# Patient Record
Sex: Female | Born: 1980 | Race: White | Hispanic: No | Smoking: Former smoker
Health system: Southern US, Community
[De-identification: ages and names within clinical notes are randomized; demographics above are authoritative.]

## PROBLEM LIST (undated history)

## (undated) DIAGNOSIS — J45909 Unspecified asthma, uncomplicated: Secondary | ICD-10-CM

---

## 2009-01-24 ENCOUNTER — Emergency Department (HOSPITAL_COMMUNITY): Admission: EM | Admit: 2009-01-24 | Discharge: 2009-01-24 | Payer: Self-pay | Admitting: Emergency Medicine

## 2012-08-26 ENCOUNTER — Encounter (HOSPITAL_COMMUNITY): Payer: Self-pay | Admitting: *Deleted

## 2012-08-26 DIAGNOSIS — F17200 Nicotine dependence, unspecified, uncomplicated: Secondary | ICD-10-CM | POA: Insufficient documentation

## 2012-08-26 DIAGNOSIS — J40 Bronchitis, not specified as acute or chronic: Secondary | ICD-10-CM | POA: Insufficient documentation

## 2012-08-26 MED ORDER — ALBUTEROL SULFATE (5 MG/ML) 0.5% IN NEBU
5.0000 mg | INHALATION_SOLUTION | RESPIRATORY_TRACT | Status: DC
  Administered 2012-08-26 – 2012-08-27 (×2): 5 mg via RESPIRATORY_TRACT
  Filled 2012-08-26 (×2): qty 1

## 2012-08-26 MED ORDER — ALBUTEROL SULFATE (5 MG/ML) 0.5% IN NEBU
5.0000 mg | INHALATION_SOLUTION | Freq: Once | RESPIRATORY_TRACT | Status: AC
  Administered 2012-08-26: 5 mg via RESPIRATORY_TRACT
  Filled 2012-08-26: qty 1

## 2012-08-26 MED ORDER — IPRATROPIUM BROMIDE 0.02 % IN SOLN
0.5000 mg | Freq: Once | RESPIRATORY_TRACT | Status: AC
  Administered 2012-08-26: 0.5 mg via RESPIRATORY_TRACT
  Filled 2012-08-26: qty 2.5

## 2012-08-26 MED ORDER — IPRATROPIUM BROMIDE 0.02 % IN SOLN
0.5000 mg | RESPIRATORY_TRACT | Status: DC
  Administered 2012-08-26 – 2012-08-27 (×2): 0.5 mg via RESPIRATORY_TRACT
  Filled 2012-08-26 (×2): qty 2.5

## 2012-08-26 NOTE — ED Notes (Signed)
The pt reports she has had difficulty breathing and wheezing and coughing since last pm.  She has a hx of asthma

## 2012-08-26 NOTE — ED Notes (Signed)
The pt is very anxious hyperventilating  And tingling all over her body

## 2012-08-26 NOTE — ED Notes (Addendum)
Spoke with Dr. Silverio Lay and advised that the patient's work of breathing had increased.  Got authorization to give a second duoneb treatment (first at 2023).  Advised Herbert Deaner, RN that the MD ordered a breathing treatment and pointed out the patient to her.  Alfonzo Feller, RN (Consulting civil engineer) is aware of the patient's increased work of breathing.

## 2012-08-26 NOTE — ED Notes (Signed)
The pt was reported  To be in respiratory difficulty by rn damon.  However i triaged this pt earlier and gave her a hhn.  At the present time she is in no respiratory difficulty.  She sounds much better little coughing and she is no longer as anxious.  hhn given per doctors order  In triage room 3

## 2012-08-27 ENCOUNTER — Encounter (HOSPITAL_COMMUNITY): Payer: Self-pay | Admitting: *Deleted

## 2012-08-27 ENCOUNTER — Emergency Department (HOSPITAL_COMMUNITY)
Admission: EM | Admit: 2012-08-27 | Discharge: 2012-08-27 | Disposition: A | Discharge status: Home / Self Care | Payer: Self-pay | Attending: Emergency Medicine | Admitting: Emergency Medicine

## 2012-08-27 ENCOUNTER — Emergency Department (HOSPITAL_COMMUNITY)
Admit: 2012-08-27 | Discharge: 2012-08-27 | Disposition: A | Discharge status: Home / Self Care | Payer: Self-pay | Attending: Emergency Medicine | Admitting: Emergency Medicine

## 2012-08-27 DIAGNOSIS — J40 Bronchitis, not specified as acute or chronic: Secondary | ICD-10-CM

## 2012-08-27 HISTORY — DX: Unspecified asthma, uncomplicated: J45.909

## 2012-08-27 LAB — CBC WITH DIFFERENTIAL/PLATELET
Eosinophils Relative: 2 % (ref 0–5)
HCT: 37.7 % (ref 36.0–46.0)
Hemoglobin: 12.3 g/dL (ref 12.0–15.0)
Lymphocytes Relative: 11 % — ABNORMAL LOW (ref 12–46)
Lymphs Abs: 1.5 10*3/uL (ref 0.7–4.0)
MCV: 89.1 fL (ref 78.0–100.0)
Monocytes Absolute: 0.8 10*3/uL (ref 0.1–1.0)
Monocytes Relative: 6 % (ref 3–12)
Platelets: 273 10*3/uL (ref 150–400)
RBC: 4.23 MIL/uL (ref 3.87–5.11)
WBC: 13.6 10*3/uL — ABNORMAL HIGH (ref 4.0–10.5)

## 2012-08-27 LAB — POCT I-STAT, CHEM 8
BUN: 7 mg/dL (ref 6–23)
Creatinine, Ser: 0.7 mg/dL (ref 0.50–1.10)
Sodium: 140 mEq/L (ref 135–145)
TCO2: 23 mmol/L (ref 0–100)

## 2012-08-27 MED ORDER — SODIUM CHLORIDE 0.9 % IV BOLUS (SEPSIS)
1000.0000 mL | Freq: Once | INTRAVENOUS | Status: AC
  Administered 2012-08-27: 1000 mL via INTRAVENOUS

## 2012-08-27 MED ORDER — AEROCHAMBER PLUS W/MASK MISC
Status: AC
  Administered 2012-08-27: 04:00:00
  Filled 2012-08-27: qty 1

## 2012-08-27 MED ORDER — ALBUTEROL SULFATE HFA 108 (90 BASE) MCG/ACT IN AERS
2.0000 | INHALATION_SPRAY | RESPIRATORY_TRACT | Status: DC | PRN
  Administered 2012-08-27: 2 via RESPIRATORY_TRACT
  Filled 2012-08-27: qty 6.7

## 2012-08-27 NOTE — ED Notes (Signed)
Back to room via w/c, alert, NAD, calm, interactive, reports temporary improvement from previous 2 nebs. Speaking in short sentences, resps e/u labored, increased wob, mild fever noted, easily provoked cough (movement, speaking & deep resps), some back pain from coughing. Family at Bs.

## 2012-08-27 NOTE — ED Provider Notes (Signed)
History     CSN: [id number]  Arrival date & time 08/26/12  2003   First MD Initiated Contact with Patient 08/27/12 0021      Chief Complaint  Patient presents with  . Shortness of Breath    (Consider location/radiation/quality/duration/timing/severity/associated sxs/prior treatment) HPI Comments: Patient reports, URI symptoms, with cough and congestion and wheezing.  For the past 36 hours.  She has a remote history of asthma as a child  The history is provided by the patient.    Past Medical History  Diagnosis Date  . Asthma     History reviewed. No pertinent past surgical history.  No family history on file.  History  Substance Use Topics  . Smoking status: Current Every Day Smoker  . Smokeless tobacco: Not on file  . Alcohol Use: No    OB History    Grav Para Term Preterm Abortions TAB SAB Ect Mult Living                  Review of Systems  Constitutional: Positive for fever. Negative for chills.  HENT: Positive for rhinorrhea.   Respiratory: Positive for shortness of breath and wheezing.   Cardiovascular: Negative for chest pain.  Musculoskeletal: Positive for myalgias.  Neurological: Negative for dizziness, weakness and headaches.    Allergies  Penicillins and Sulfa antibiotics  Home Medications   Current Outpatient Rx  Name Route Sig Dispense Refill  . IBUPROFEN 200 MG PO TABS Oral Take 200 mg by mouth every 6 (six) hours as needed. For pain      BP 109/71  Pulse 97  Temp 98.8 F (37.1 C) (Oral)  Resp 20  SpO2 94%  Physical Exam  Constitutional: She is oriented to person, place, and time. She appears well-developed.  Eyes: Pupils are equal, round, and reactive to light.  Neck: Normal range of motion.  Cardiovascular: Tachycardia present.   Pulmonary/Chest: No respiratory distress. She has wheezes. She exhibits no tenderness.  Musculoskeletal: Normal range of motion.  Neurological: She is alert and oriented to person, place, and time.    Skin: Skin is warm.    ED Course  Procedures (including critical care time)  Labs Reviewed  CBC WITH DIFFERENTIAL - Abnormal; Notable for the following:    WBC 13.6 (*)     Neutrophils Relative 81 (*)     Neutro Abs 11.1 (*)     Lymphocytes Relative 11 (*)     All other components within normal limits  POCT I-STAT, CHEM 8 - Abnormal; Notable for the following:    Potassium 3.4 (*)     Glucose, Bld 103 (*)     All other components within normal limits   Dg Chest 2 View  08/27/2012  *RADIOLOGY REPORT*  Clinical Data: Fever, cough and shortness of breath.  CHEST - 2 VIEW  Comparison: None.  Findings: Lungs are clear.  Heart size is normal.  No pneumothorax or pleural fluid.  IMPRESSION: Negative chest.   Original Report Authenticated By: Bernadene Bell. D'ALESSIO, M.D.      1. Bronchitis       MDM   Patient has been given several albuterol treatment in the emergency department.  She is starting to loosen up and breathe easier.  She will be provided with an albuterol inhaler upon discharge with instructions for its use accessory reveals no pneumonia        Arman Filter, NP 08/27/12 0333  Arman Filter, NP 08/27/12 5284  Arman Filter,  NP 08/27/12 0333  Arman Filter, NP 08/27/12 0335  Arman Filter, NP 08/27/12 [phone]

## 2012-08-27 NOTE — ED Notes (Signed)
Pt ambulatory to bathroom

## 2013-09-13 ENCOUNTER — Emergency Department (HOSPITAL_BASED_OUTPATIENT_CLINIC_OR_DEPARTMENT_OTHER): Payer: No Typology Code available for payment source

## 2013-09-13 ENCOUNTER — Emergency Department (HOSPITAL_BASED_OUTPATIENT_CLINIC_OR_DEPARTMENT_OTHER): Payer: Self-pay

## 2013-09-13 ENCOUNTER — Encounter (HOSPITAL_BASED_OUTPATIENT_CLINIC_OR_DEPARTMENT_OTHER): Payer: Self-pay | Admitting: Emergency Medicine

## 2013-09-13 ENCOUNTER — Emergency Department (HOSPITAL_BASED_OUTPATIENT_CLINIC_OR_DEPARTMENT_OTHER)
Admission: EM | Admit: 2013-09-13 | Discharge: 2013-09-14 | Disposition: A | Discharge status: Home / Self Care | Payer: No Typology Code available for payment source | Attending: Emergency Medicine | Admitting: Emergency Medicine

## 2013-09-13 DIAGNOSIS — Z3202 Encounter for pregnancy test, result negative: Secondary | ICD-10-CM | POA: Insufficient documentation

## 2013-09-13 DIAGNOSIS — Z87891 Personal history of nicotine dependence: Secondary | ICD-10-CM | POA: Insufficient documentation

## 2013-09-13 DIAGNOSIS — S060X9A Concussion with loss of consciousness of unspecified duration, initial encounter: Secondary | ICD-10-CM | POA: Insufficient documentation

## 2013-09-13 DIAGNOSIS — S0993XA Unspecified injury of face, initial encounter: Secondary | ICD-10-CM | POA: Insufficient documentation

## 2013-09-13 DIAGNOSIS — Z880 Allergy status to penicillin: Secondary | ICD-10-CM | POA: Insufficient documentation

## 2013-09-13 DIAGNOSIS — S0990XA Unspecified injury of head, initial encounter: Secondary | ICD-10-CM

## 2013-09-13 DIAGNOSIS — Y9389 Activity, other specified: Secondary | ICD-10-CM | POA: Insufficient documentation

## 2013-09-13 DIAGNOSIS — Y92410 Unspecified street and highway as the place of occurrence of the external cause: Secondary | ICD-10-CM | POA: Insufficient documentation

## 2013-09-13 DIAGNOSIS — M542 Cervicalgia: Secondary | ICD-10-CM

## 2013-09-13 DIAGNOSIS — J45909 Unspecified asthma, uncomplicated: Secondary | ICD-10-CM | POA: Insufficient documentation
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
## 2013-09-13 NOTE — ED Notes (Signed)
HPPD at bedside 

## 2013-09-13 NOTE — ED Notes (Signed)
C Collar remains in place from EMS.

## 2013-09-14 MED ORDER — IBUPROFEN 800 MG PO TABS: 800.0000 mg | ORAL_TABLET | Freq: Three times a day (TID) | ORAL | Status: AC

## 2013-09-14 MED ORDER — IBUPROFEN 800 MG PO TABS
800.0000 mg | ORAL_TABLET | Freq: Once | ORAL | Status: AC
  Administered 2013-09-14: 800 mg via ORAL
  Filled 2013-09-14: qty 1

## 2013-09-14 NOTE — ED Notes (Signed)
Dr. Karma Ganja at bedside, C Collar has been removed.

## 2013-09-14 NOTE — ED Notes (Signed)
Has returned from radiology. 

## 2013-11-12 ENCOUNTER — Emergency Department (HOSPITAL_COMMUNITY): Payer: Self-pay

## 2013-11-12 ENCOUNTER — Emergency Department (HOSPITAL_COMMUNITY)
Admission: EM | Admit: 2013-11-12 | Discharge: 2013-11-12 | Disposition: A | Discharge status: Home / Self Care | Payer: Self-pay | Attending: Emergency Medicine | Admitting: Emergency Medicine

## 2013-11-12 ENCOUNTER — Encounter (HOSPITAL_COMMUNITY): Payer: Self-pay | Admitting: Emergency Medicine

## 2013-11-12 DIAGNOSIS — Z87891 Personal history of nicotine dependence: Secondary | ICD-10-CM | POA: Insufficient documentation

## 2013-11-12 DIAGNOSIS — G510 Bell's palsy: Secondary | ICD-10-CM | POA: Insufficient documentation

## 2013-11-12 DIAGNOSIS — J45909 Unspecified asthma, uncomplicated: Secondary | ICD-10-CM | POA: Insufficient documentation

## 2013-11-12 DIAGNOSIS — Z880 Allergy status to penicillin: Secondary | ICD-10-CM | POA: Insufficient documentation

## 2013-11-12 NOTE — ED Notes (Signed)
Left facial droop and numbness x 3 weeks   Sharp pain in neck got adjusted by chiropractor 3 weeks ago . And was  Dx w/ bells palsy  Just not getting better given pred and she finishsed
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
## 2013-11-12 NOTE — ED Notes (Signed)
PT reports facial droop started 2 week and 2 days ago . Pt also reports pain to the neck on Lt side and tingling in Lt arm.

## 2013-11-12 NOTE — Discharge Instructions (Signed)
Please read and follow all provided instructions.  Your diagnoses today include:  1. Bell's palsy     Tests performed today include:  X-ray of neck - shows no broken bones  MRI brain - appears normal  Vital signs. See below for your results today.   Medications prescribed:   None   Take any prescribed medications only as directed.  Home care instructions:  Follow any educational materials contained in this packet.  Follow-up instructions: Please follow-up with your primary care provider or the neurologist provided in the next 7 days for further evaluation of your symptoms.    Return instructions:   Please return to the Emergency Department if you experience worsening symptoms.  Return if you have weakness in your arms or legs, slurred speech, trouble walking or talking, confusion, or trouble with your balance.   Please return if you have any other emergent concerns.  Additional Information:  Your vital signs today were: BP 121/70   Pulse 94   Temp(Src) 98.3 F (36.8 C) (Oral)   Resp 16   SpO2 96%   LMP 11/05/2013 If your blood pressure (BP) was elevated above 135/85 this visit, please have this repeated by your doctor within one month. --------------

## 2013-11-12 NOTE — ED Provider Notes (Signed)
CSN: [id number]     Arrival date & time 11/12/13  1431 History   First MD Initiated Contact with Patient 11/12/13 1657     Chief Complaint  Patient presents with  . Facial Pain   (Consider location/radiation/quality/duration/timing/severity/associated sxs/prior Treatment) HPI Ms. Moyinoluwa Dawe is a 32 y.o. female w/ PMHx of Asthma, presents to the ED w/ complaints of L. sided facial pain, facial droop, and numbness for the past 2-3 weeks. Patient claims she was seen at the chiropractor about 2.5 weeks ago and had a neck adjustment done at that time. She returned home later in the day and claimed to have some paraesthesias in the left side of her face, followed by numbness, and motor dysfunction. She claims that she cannot feel the left side of her face, cannot completely close her left eye, and cannot taste anything on the left side of her tongue. After her "adjustment", she returned to her MD (also affiliated w/ chiropractor) who prescribed her Prednisone 60 mg po qd for 5-7 days. She has had no improvement after corticosteroid therapy. She also claims to have some mild paraesthesias in her L. upper and lower extremities (not seen on physical exam). The patient denies any previous history of cold sores, or herpes infection. She otherwise denies other issues. No recent chest pain, SOB, nausea, vomiting, fever, chills, abdominal pain, or diarrhea.  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   Past Medical History  Diagnosis Date  . Asthma    History reviewed. No pertinent past surgical history. No family history on file. History  Substance Use Topics  . Smoking status: Former Games developer  . Smokeless tobacco: Never Used  . Alcohol Use: No   OB History   Grav Para Term Preterm Abortions TAB SAB Ect Mult Living                 Review of Systems General: Denies fever, chills, diaphoresis, appetite change and fatigue.  Respiratory: Denies SOB, DOE, cough,  chest tightness, and wheezing.   Cardiovascular: Denies chest pain, palpitations and leg swelling.  Gastrointestinal: Denies nausea, vomiting, abdominal pain, diarrhea, constipation, blood in stool and abdominal distention.  Genitourinary: Denies dysuria, urgency, frequency, hematuria, flank pain and difficulty urinating.  Endocrine: Denies hot or cold intolerance, sweats, polyuria, polydipsia. Musculoskeletal: Denies myalgias, back pain, joint swelling, arthralgias and gait problem.  Skin: Denies pallor, rash and wounds.  Neurological: Positive for numbness/tingling in left face w/ motor dysfunction. Also describes left hand weakness. Denies dizziness, seizures, syncope, weakness, lightheadedness, and headaches.  Psychiatric/Behavioral: Denies mood changes, confusion, nervousness, sleep disturbance and agitation.  Allergies  Penicillins and Sulfa antibiotics  Home Medications   Current Outpatient Rx  Name  Route  Sig  Dispense  Refill  . ibuprofen (ADVIL,MOTRIN) 800 MG tablet   Oral   Take 1 tablet (800 mg total) by mouth 3 (three) times daily.   21 tablet   0   . Ketotifen Fumarate (THERA TEARS ALLERGY OP)   Ophthalmic   Apply 1 drop to eye 3 (three) times daily as needed (dry eyes).          Physical Exam Filed Vitals:   11/12/13 1742 11/12/13 1930 11/12/13 1945 11/12/13 2000  BP: 136/92 136/81 141/85 127/80  Pulse: 103 99 97 94  Temp: 98.3 F (36.8 C)     TempSrc: Oral     Resp: 18     SpO2: 97% 98% 99% 98%   General: Vital signs  reviewed.  Patient is a well-developed and well-nourished, in no acute distress and cooperative with exam. Alert and oriented x3.  Head: Normocephalic and atraumatic. Eyes: PERRL, EOMI, conjunctivae normal, No scleral icterus.  Neck: Supple, trachea midline, normal ROM, No JVD, masses, thyromegaly, or carotid bruit present.  Cardiovascular: RRR, S1 normal, S2 normal, no murmurs, gallops, or rubs. Pulmonary/Chest: Normal respiratory effort,  CTAB, no wheezes, rales, or rhonchi. Abdominal: Soft, non-tender, non-distended, bowel sounds are normal, no masses, organomegaly, or guarding present.  Musculoskeletal: No joint deformities, erythema, or stiffness, ROM full and no nontender. Extremities: No swelling or edema,  pulses symmetric and intact bilaterally. No cyanosis or clubbing. Neurological: A&O x3, Strength is normal and symmetric bilaterally. Left sided facial weakness, and left-sided sensory losses. Patient unable to close left eye, smile asymmetrical. Other CN's intact. Otherwise, no focal motor deficit, sensory intact to light touch bilaterally.  Skin: Warm, dry and intact. No rashes or erythema. Psychiatric: Normal mood and affect. speech and behavior is normal. Cognition and memory are normal.   ED Course  Procedures (including critical care time) Labs Review Labs Reviewed - No data to display Imaging Review Dg Cervical Spine Complete  11/12/2013   CLINICAL DATA:  Left neck pain.  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  There is no evidence of acute fracture or subluxation.  Mild bony foraminal narrowing bilaterally is identified from C3-C6.  No suspicious bony lesions are identified.  Mild degenerative disc disease at C2-3 noted.  IMPRESSION: No evidence of acute abnormality.  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   Electronically Signed   By: Laveda Abbe M.D.   On: 11/12/2013 19:35    EKG Interpretation   None       MDM   Ms. Jadene Stemmer is a 32 y.o. female w/ PMHx of Asthma, presents to the ED w/ complaints of L. sided facial pain, facial droop, and numbness for the past 3 weeks. 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 Patient claims these findings started after chiropractic adjustment,  however, brainstem damage at height of CN VII would be very unlikely. Previously treated w/ Prednisone 60 mg qd for 5 days w/ no improvement. House Brackmann classification would qualify the patient as Grade V, or severe dysfunction, meaning that her return to normal function is poor at this time. -XR cervical spine shows no evidence of acute abnormality, but significant for congenital fusion anomaly from C3- C6 with mild bilateral bony foraminal narrowing. -MRI brain ordered.       Courtney Paris, MD 11/12/13 2031

## 2013-11-12 NOTE — ED Notes (Signed)
PA at bedside.

## 2013-11-12 NOTE — ED Provider Notes (Signed)
8:39 PM Handoff from Dr. Yetta Barre and Dr. Judd Lien at shift change. Pt with left facial sx x 3 weeks. Has finished course of prednisone. Dr. Judd Lien has spoken with neuro, reccs MRI. Plan: d/c home pending MRI results.   8:48 PM Pt seen. I agree sx most c/w Bell's palsy. Forehead weakness present along with L facial paralysis. Pending MRI.   10:29 PM MRI results reviewed. All results reviewed with family. Will d/c to see PCP/neurologist. Questions answered. Exam stable.   Renne Crigler, PA-C 11/12/13 2229

## 2013-11-14 NOTE — ED Provider Notes (Signed)
Medical screening examination/treatment/procedure(s) were performed by non-physician practitioner and as supervising physician I was immediately available for consultation/collaboration.     Geoffery Lyons, MD 11/14/13 1049

## 2018-05-05 ENCOUNTER — Encounter (HOSPITAL_COMMUNITY): Payer: Self-pay | Admitting: Oncology

## 2018-05-05 ENCOUNTER — Emergency Department (HOSPITAL_COMMUNITY)
Admission: EM | Admit: 2018-05-05 | Discharge: 2018-05-05 | Disposition: A | Discharge status: Home / Self Care | Payer: BLUE CROSS/BLUE SHIELD | Attending: Emergency Medicine | Admitting: Emergency Medicine

## 2018-05-05 ENCOUNTER — Other Ambulatory Visit: Payer: Self-pay

## 2018-05-05 ENCOUNTER — Emergency Department (HOSPITAL_COMMUNITY): Payer: BLUE CROSS/BLUE SHIELD

## 2018-05-05 DIAGNOSIS — S1980XA Other specified injuries of unspecified part of neck, initial encounter: Secondary | ICD-10-CM | POA: Diagnosis present

## 2018-05-05 DIAGNOSIS — S39012A Strain of muscle, fascia and tendon of lower back, initial encounter: Secondary | ICD-10-CM | POA: Diagnosis not present

## 2018-05-05 DIAGNOSIS — Y999 Unspecified external cause status: Secondary | ICD-10-CM | POA: Diagnosis not present

## 2018-05-05 DIAGNOSIS — Z87891 Personal history of nicotine dependence: Secondary | ICD-10-CM | POA: Insufficient documentation

## 2018-05-05 DIAGNOSIS — S161XXA Strain of muscle, fascia and tendon at neck level, initial encounter: Secondary | ICD-10-CM | POA: Insufficient documentation

## 2018-05-05 DIAGNOSIS — Y9389 Activity, other specified: Secondary | ICD-10-CM | POA: Diagnosis not present

## 2018-05-05 DIAGNOSIS — Z3202 Encounter for pregnancy test, result negative: Secondary | ICD-10-CM | POA: Diagnosis not present

## 2018-05-05 DIAGNOSIS — J45909 Unspecified asthma, uncomplicated: Secondary | ICD-10-CM | POA: Diagnosis not present

## 2018-05-05 DIAGNOSIS — S20219A Contusion of unspecified front wall of thorax, initial encounter: Secondary | ICD-10-CM | POA: Diagnosis not present

## 2018-05-05 DIAGNOSIS — Y92410 Unspecified street and highway as the place of occurrence of the external cause: Secondary | ICD-10-CM | POA: Diagnosis not present

## 2018-05-05 LAB — POC URINE PREG, ED: PREG TEST UR: NEGATIVE

## 2018-05-05 MED ORDER — KETOROLAC TROMETHAMINE 60 MG/2ML IM SOLN
30.0000 mg | Freq: Once | INTRAMUSCULAR | Status: AC
  Administered 2018-05-05: 30 mg via INTRAMUSCULAR
  Filled 2018-05-05: qty 2

## 2018-05-05 MED ORDER — TRAMADOL HCL 50 MG PO TABS: 50.0000 mg | ORAL_TABLET | Freq: Four times a day (QID) | ORAL | 0 refills | Status: AC | PRN

## 2018-05-05 MED ORDER — NAPROXEN 500 MG PO TABS: 500.0000 mg | ORAL_TABLET | Freq: Two times a day (BID) | ORAL | 0 refills | Status: AC

## 2018-05-05 MED ORDER — METHOCARBAMOL 500 MG PO TABS: 500.0000 mg | ORAL_TABLET | Freq: Two times a day (BID) | ORAL | 0 refills | Status: AC

## 2018-05-05 MED ORDER — OXYCODONE-ACETAMINOPHEN 5-325 MG PO TABS
1.0000 | ORAL_TABLET | Freq: Once | ORAL | Status: AC
  Administered 2018-05-05: 1 via ORAL
  Filled 2018-05-05: qty 1
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
## 2018-05-05 NOTE — Discharge Instructions (Addendum)
Ice/heat. Naprosyn for pain. Tramadol for severe pain. Robaxin for spasms. Follow up with family doctor.
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
